# Patient Record
Sex: Female | Born: 1995 | Race: White | Hispanic: No | Marital: Single | Smoking: Never smoker
Health system: Southern US, Community
[De-identification: ages and names within clinical notes are randomized; demographics above are authoritative.]

## PROBLEM LIST (undated history)

## (undated) DIAGNOSIS — G43909 Migraine, unspecified, not intractable, without status migrainosus: Secondary | ICD-10-CM

## (undated) HISTORY — PX: CHOLECYSTECTOMY: SHX55

---

## 2017-09-14 ENCOUNTER — Encounter (HOSPITAL_COMMUNITY): Payer: Self-pay | Admitting: Emergency Medicine

## 2017-09-14 ENCOUNTER — Emergency Department (HOSPITAL_COMMUNITY)
Admission: EM | Admit: 2017-09-14 | Discharge: 2017-09-15 | Disposition: A | Discharge status: Home / Self Care | Payer: Self-pay | Attending: Emergency Medicine | Admitting: Emergency Medicine

## 2017-09-14 DIAGNOSIS — R102 Pelvic and perineal pain: Secondary | ICD-10-CM | POA: Insufficient documentation

## 2017-09-14 HISTORY — DX: Migraine, unspecified, not intractable, without status migrainosus: G43.909

## 2017-09-14 LAB — COMPREHENSIVE METABOLIC PANEL
ALT: 31 U/L (ref 14–54)
AST: 22 U/L (ref 15–41)
Albumin: 3.9 g/dL (ref 3.5–5.0)
Alkaline Phosphatase: 59 U/L (ref 38–126)
Anion gap: 7 (ref 5–15)
BILIRUBIN TOTAL: 0.6 mg/dL (ref 0.3–1.2)
BUN: 5 mg/dL — ABNORMAL LOW (ref 6–20)
CHLORIDE: 103 mmol/L (ref 101–111)
CO2: 26 mmol/L (ref 22–32)
Calcium: 8.7 mg/dL — ABNORMAL LOW (ref 8.9–10.3)
Creatinine, Ser: 0.8 mg/dL (ref 0.44–1.00)
Glucose, Bld: 90 mg/dL (ref 65–99)
POTASSIUM: 3.5 mmol/L (ref 3.5–5.1)
Sodium: 136 mmol/L (ref 135–145)
Total Protein: 6.5 g/dL (ref 6.5–8.1)

## 2017-09-14 LAB — URINALYSIS, ROUTINE W REFLEX MICROSCOPIC
Bilirubin Urine: NEGATIVE
Glucose, UA: NEGATIVE mg/dL
Hgb urine dipstick: NEGATIVE
Ketones, ur: NEGATIVE mg/dL
LEUKOCYTES UA: NEGATIVE
Nitrite: NEGATIVE
PH: 7 (ref 5.0–8.0)
Protein, ur: NEGATIVE mg/dL
Specific Gravity, Urine: 1.018 (ref 1.005–1.030)

## 2017-09-14 LAB — CBC
HEMATOCRIT: 41.5 % (ref 36.0–46.0)
Hemoglobin: 14 g/dL (ref 12.0–15.0)
MCH: 29.2 pg (ref 26.0–34.0)
MCHC: 33.7 g/dL (ref 30.0–36.0)
MCV: 86.5 fL (ref 78.0–100.0)
Platelets: 243 10*3/uL (ref 150–400)
RBC: 4.8 MIL/uL (ref 3.87–5.11)
RDW: 12.4 % (ref 11.5–15.5)
WBC: 10 10*3/uL (ref 4.0–10.5)

## 2017-09-14 LAB — I-STAT BETA HCG BLOOD, ED (MC, WL, AP ONLY): I-stat hCG, quantitative: <5 m[IU]/mL (ref <5)

## 2017-09-14 LAB — LIPASE, BLOOD: Lipase: 36 U/L (ref 11–51)

## 2017-09-14 NOTE — ED Triage Notes (Signed)
Reports lower abdominal cramping for a few weeks worse.  Has had 3 positive tests a few days ago and had 4 negative tests.  Had mirena currently.  No bleeding reported at this time.

## 2017-09-15 ENCOUNTER — Emergency Department (HOSPITAL_COMMUNITY): Payer: Self-pay

## 2017-09-15 LAB — WET PREP, GENITAL
Sperm: NONE SEEN
Trich, Wet Prep: NONE SEEN
Yeast Wet Prep HPF POC: NONE SEEN

## 2017-09-15 NOTE — Discharge Instructions (Addendum)
Take ibuprofen or naproxen as needed for pain. °

## 2017-09-15 NOTE — ED Notes (Signed)
Delay in lab draw edp at bedside. 

## 2017-09-15 NOTE — ED Provider Notes (Signed)
[facility] EMERGENCY DEPARTMENT Provider Note   CSN: [id number] Arrival date & time: 09/14/17  2138     History   Chief Complaint Chief Complaint  Patient presents with  . Abdominal Pain    HPI Isabella Mendoza is a 22 y.o. female.  The history is provided by the patient.  She complains of lower abdominal cramping for the last several weeks, getting gradually worse.  Pain gets is severe as 6/10, and then subsides to 2/10.  Pain is worse when she bends over.  She denies fever, chills, sweats.  She denies back pain.  There is been no nausea, vomiting, diarrhea.  She denies urinary urgency, frequency, tenesmus, dysuria.  She denies any vaginal discharge.  She has had a Mirena IUD for the past year.  She has been taking acetaminophen for pain, with minimal relief.  Past Medical History:  Diagnosis Date  . Migraines     There are no active problems to display for this patient.   Past Surgical History:  Procedure Laterality Date  . CESAREAN SECTION     X2  . CHOLECYSTECTOMY       OB History   None      Home Medications    Prior to Admission medications   Not on File    Family History No family history on file.  Social History Social History   Tobacco Use  . Smoking status: Never Smoker  . Smokeless tobacco: Never Used  Substance Use Topics  . Alcohol use: Yes    Comment: occasionally  . Drug use: Never     Allergies   Patient has no allergy information on record.   Review of Systems Review of Systems  All other systems reviewed and are negative.    Physical Exam Updated Vital Signs BP 112/67 (BP Location: Right Arm)   Pulse 60   Temp 98.1 F (36.7 C) (Oral)   Resp 16   Ht  (1.549 m)   Wt 72.6 kg (160 lb)   SpO2 100%   BMI 30.23 kg/m   Physical Exam  Nursing note and vitals reviewed.  22 year old female, resting comfortably and in no acute distress. Vital signs are normal. Oxygen saturation is 100%, which is  normal. Head is normocephalic and atraumatic. PERRLA, EOMI. Oropharynx is clear. Neck is nontender and supple without adenopathy or JVD. Back is nontender and there is no CVA tenderness. Lungs are clear without rales, wheezes, or rhonchi. Chest is nontender. Heart has regular rate and rhythm without murmur. Abdomen is soft, flat, nontender without masses or hepatosplenomegaly and peristalsis is normoactive. Pelvic: Normal external female genitalia.  Cervix is closed with IUD string visible.  No vaginal discharge.  No cervical motion tenderness.  Fundus is normal size and position.  No adnexal masses or tenderness. Extremities have no cyanosis or edema, full range of motion is present. Skin is warm and dry without rash. Neurologic: Mental status is normal, cranial nerves are intact, there are no motor or sensory deficits.  ED Treatments / Results  Labs (all labs ordered are listed, but only abnormal results are displayed) Labs Reviewed  WET PREP, GENITAL - Abnormal; Notable for the following components:      Result Value   Clue Cells Wet Prep HPF POC PRESENT (*)    WBC, Wet Prep HPF POC FEW (*)    All other components within normal limits  COMPREHENSIVE METABOLIC PANEL - Abnormal; Notable for the following components:   BUN  5 (*)    Calcium 8.7 (*)    All other components within normal limits  LIPASE, BLOOD  CBC  URINALYSIS, ROUTINE W REFLEX MICROSCOPIC  RPR  HIV ANTIBODY (ROUTINE TESTING)  I-STAT BETA HCG BLOOD, ED ([facility], [facility], [facility] ONLY)  GC/CHLAMYDIA PROBE AMP (B[facility]o) NOT AT [facility]   Radiology US Pelvic Complete With Transvaginal  Result Date: 09/15/2017 CLINICAL DATA:  Pelvic pain for 2 weeks. EXAM: TRANSABDOMINAL AND TRANSVAGINAL ULTRASOUND OF PELVIS TECHNIQUE: Both transabdominal and transvaginal ultrasound examinations of the pelvis were performed. Transabdominal technique was performed for global imaging of the pelvis including uterus, ovaries, adnexal regions, and pelvic  cul-de-sac. It was necessary to proceed with endovaginal exam following the transabdominal exam to visualize the ovaries and endometrium. COMPARISON:  None FINDINGS: Uterus Measurements: 9 x 4.2 x 5.3 cm. Uterus is retroflexed period. No fibroids or other mass visualized. Endometrium Thickness: 4.9 mm. There is an echogenic linear focus demonstrated along the lower uterine segment. This could represent an out of position intrauterine device. Correlation with the history of intrauterine device placement is recommended. Alternatively, this could represent postoperative changes associated with history of Caesarean sections. The endometrium is otherwise homogeneous and normal. Right ovary Measurements: 5.3 x 2.7 x 4 cm. A complex cyst, likely hemorrhagic cyst is demonstrated measuring 4.4 x 1.9 x 3.9 cm. No flow is appreciated within the cyst. Normal follicular changes. Left ovary Measurements: 3.2 x 1.7 x 1.9 cm. Normal appearance/no adnexal mass. Other findings Small amount of free fluid in the pelvis. IMPRESSION: 1. Echogenic linear focus in the lower uterine segment. Consider out of place intrauterine device or postoperative changes associated with history of cesarean sections. 2. Hemorrhagic cyst on the right ovary measuring 4.4 cm maximal dimension. Based on size in a premenopausal patient, additional follow-up is not indicated. 3. Small amount of free fluid in the pelvis is likely physiologic. Electronically Signed   By: Burman Nieves M.D.   On: 09/15/2017 06:29    Procedures Procedures   Medications Ordered in ED Medications - No data to display   Initial Impression / Assessment and Plan / ED Course  I have reviewed the triage vital signs and the nursing notes.  Pertinent labs & imaging results that were available during my care of the patient were reviewed by me and considered in my medical decision making (see chart for details).  Pelvic pain of uncertain cause.  Possible endometriosis.  With  normal exam, doubt pelvic inflammatory disease.  Laboratory work-up is unremarkable including clean urine.  Old records are reviewed, and she was seen in the emergency department at [facility] 3 days ago with no diagnosis found.  She will be sent for a pelvic ultrasound.  Wet prep is positive for clue cells, but clinically she does not have bacterial vaginosis so will not be put on antibiotics.  Pelvic ultrasound was significant for possibly malpositioned IUD versus scar tissue from prior C-section.  She has had 2 cesarean births.  Patient is advised of these findings and is referred to [facility] outpatient clinic for follow-up.  Told to try using NSAIDs rather than acetaminophen for pelvic pain.  Return precautions discussed.  Final Clinical Impressions(s) / ED Diagnoses   Final diagnoses:  Pelvic pain in female    ED Discharge Orders    None       Dione Booze, MD 09/15/17 [phone]

## 2017-09-15 NOTE — ED Notes (Signed)
Patient transported to Ultrasound 

## 2017-09-16 LAB — GC/CHLAMYDIA PROBE AMP (~~LOC~~) NOT AT ARMC
Chlamydia: NEGATIVE
Neisseria Gonorrhea: NEGATIVE

## 2017-09-16 LAB — HIV ANTIBODY (ROUTINE TESTING W REFLEX): HIV Screen 4th Generation wRfx: NONREACTIVE

## 2017-09-16 LAB — RPR: RPR: NONREACTIVE

## 2017-11-10 ENCOUNTER — Emergency Department (HOSPITAL_COMMUNITY)
Admission: EM | Admit: 2017-11-10 | Discharge: 2017-11-10 | Disposition: A | Discharge status: Home / Self Care | Payer: Medicaid Other | Attending: Emergency Medicine | Admitting: Emergency Medicine

## 2017-11-10 ENCOUNTER — Other Ambulatory Visit: Payer: Self-pay

## 2017-11-10 ENCOUNTER — Encounter (HOSPITAL_COMMUNITY): Payer: Self-pay | Admitting: *Deleted

## 2017-11-10 DIAGNOSIS — Z3202 Encounter for pregnancy test, result negative: Secondary | ICD-10-CM | POA: Insufficient documentation

## 2017-11-10 DIAGNOSIS — Z79899 Other long term (current) drug therapy: Secondary | ICD-10-CM | POA: Insufficient documentation

## 2017-11-10 DIAGNOSIS — N12 Tubulo-interstitial nephritis, not specified as acute or chronic: Secondary | ICD-10-CM | POA: Insufficient documentation

## 2017-11-10 LAB — URINALYSIS, ROUTINE W REFLEX MICROSCOPIC
Bilirubin Urine: NEGATIVE
GLUCOSE, UA: NEGATIVE mg/dL
Ketones, ur: NEGATIVE mg/dL
Nitrite: NEGATIVE
Specific Gravity, Urine: 1.01 (ref 1.005–1.030)
pH: 6.5 (ref 5.0–8.0)

## 2017-11-10 LAB — PREGNANCY, URINE: Preg Test, Ur: NEGATIVE

## 2017-11-10 LAB — URINALYSIS, MICROSCOPIC (REFLEX): RBC / HPF: NONE SEEN RBC/hpf (ref 0–5)

## 2017-11-10 MED ORDER — CEPHALEXIN 500 MG PO CAPS: 500.0000 mg | ORAL_CAPSULE | Freq: Three times a day (TID) | ORAL | 0 refills | Status: AC

## 2017-11-10 MED ORDER — CEPHALEXIN 500 MG PO CAPS: 500.0000 mg | ORAL_CAPSULE | Freq: Three times a day (TID) | ORAL | 0 refills | Status: DC

## 2017-11-10 NOTE — ED Triage Notes (Signed)
Pt reports right side back pain for several days, radiates across her back and occ into her abd. Denies pain with urination or fever but is having some nausea.

## 2017-11-10 NOTE — ED Provider Notes (Signed)
[facility] EMERGENCY DEPARTMENT Provider Note   CSN: [id number] Arrival date & time: 11/10/17  1513     History   Chief Complaint Chief Complaint  Patient presents with  . Flank Pain  . Back Pain    HPI Javaria Knapke is a 22 y.o. female.  HPI   22 year old female presents today with complaints of right flank and back pain. Patient notes one week ago she developed urinary frequency, last today she had some episodes of nausea, no vomiting. She describes the pain is achy sharp with movement, denies any vaginal discharge or bleeding, denies any abdominal pain vomiting or fever. No recent injury or exposure or urinary tract infections.    Past Medical History:  Diagnosis Date  . Migraines     There are no active problems to display for this patient.   Past Surgical History:  Procedure Laterality Date  . CESAREAN SECTION     X2  . CHOLECYSTECTOMY       OB History   None      Home Medications    Prior to Admission medications   Medication Sig Start Date End Date Taking? Authorizing Provider  acetaminophen (TYLENOL) 500 MG tablet Take 500 mg by mouth 2 (two) times daily as needed for headache (migraine).   Yes [provider]  ibuprofen (ADVIL,MOTRIN) 200 MG tablet Take 600-800 mg by mouth 2 (two) times daily as needed for headache (migraine).   Yes [provider]  cephALEXin (KEFLEX) 500 MG capsule Take 1 capsule (500 mg total) by mouth 3 (three) times daily. 11/10/17   Eyvonne Mechanic, PA-C    Family History History reviewed. No pertinent family history.  Social History Social History   Tobacco Use  . Smoking status: Never Smoker  . Smokeless tobacco: Never Used  Substance Use Topics  . Alcohol use: Yes    Comment: occasionally  . Drug use: Never     Allergies   Celery oil; Oyster calcium; Erythromycin; and Sulfisoxazole   Review of Systems Review of Systems  All other systems reviewed and are  negative.    Physical Exam Updated Vital Signs BP 115/75 (BP Location: Right Arm)   Pulse 62   Temp 98.6 F (37 C) (Oral)   Resp 14   LMP 10/11/2017   SpO2 100%   Physical Exam  Constitutional: She is oriented to person, place, and time. She appears well-developed and well-nourished.  HENT:  Head: Normocephalic and atraumatic.  Eyes: Pupils are equal, round, and reactive to light. Conjunctivae are normal. Right eye exhibits no discharge. Left eye exhibits no discharge. No scleral icterus.  Neck: Normal range of motion. No JVD present. No tracheal deviation present.  Pulmonary/Chest: Effort normal. No stridor.  Abdominal: She exhibits no distension and no mass. There is no tenderness. There is no rebound and no guarding. No hernia.  No CVA tenderness, no tenderness to the back  Neurological: She is alert and oriented to person, place, and time. Coordination normal.  Psychiatric: She has a normal mood and affect. Her behavior is normal. Judgment and thought content normal.  Nursing note and vitals reviewed.   ED Treatments / Results  Labs (all labs ordered are listed, but only abnormal results are displayed) Labs Reviewed  URINALYSIS, ROUTINE W REFLEX MICROSCOPIC - Abnormal; Notable for the following components:      Result Value   Color, Urine YELLOW (*)    APPearance CLOUDY (*)    Hgb urine dipstick SMALL (*)  Protein, ur TRACE (*)    Leukocytes, UA MODERATE (*)    All other components within normal limits  URINALYSIS, MICROSCOPIC (REFLEX) - Abnormal; Notable for the following components:   Bacteria, UA FEW (*)    All other components within normal limits  URINE CULTURE  PREGNANCY, URINE    EKG None  Radiology No results found.  Procedures Procedures (including critical care time)  Medications Ordered in ED Medications - No data to display   Initial Impression / Assessment and Plan / ED Course  I have reviewed the triage vital signs and the nursing  notes.  Pertinent labs & imaging results that were available during my care of the patient were reviewed by me and considered in my medical decision making (see chart for details).     Labs: urinalysis, urine culture, urine pregnancy  Imaging:  Consults:  Therapeutics:  Discharge Meds: Keflex  Assessment/Plan: 22 year old female presents today with complaints of urinary frequency and flank pain. Patient's urine does show moderate leukocytes, she'll be treated for urinary tract infection with questionable pyelonephritis. She is well-appearing distress, no elevation in temperature and heart rate. Patient tolerating by mouth discharged with antibiotics, strict return precautions. She verbalized understanding and agreement to today's plan.      Final Clinical Impressions(s) / ED Diagnoses   Final diagnoses:  Pyelonephritis    ED Discharge Orders        Ordered    cephALEXin (KEFLEX) 500 MG capsule  3 times daily,   Status:  Discontinued     11/10/17 2003    cephALEXin (KEFLEX) 500 MG capsule  3 times daily     11/10/17 2010       Eyvonne MechanicHedges, Shaely Gadberry, PA-C 11/11/17 1340    Jacalyn LefevreHaviland, Julie, MD 11/11/17 1615

## 2017-11-10 NOTE — Discharge Instructions (Addendum)
Please read attached information. If you experience any new or worsening signs or symptoms please return to the emergency room for evaluation. Please follow-up with your primary care provider or specialist as discussed. Please use medication prescribed only as directed and discontinue taking if you have any concerning signs or symptoms.   °

## 2017-11-13 LAB — URINE CULTURE: Culture: >=100000 — AB

## 2017-11-14 ENCOUNTER — Telehealth: Payer: Self-pay

## 2017-11-14 NOTE — Telephone Encounter (Signed)
Post ED Visit - Positive Culture Follow-up  Culture report reviewed by antimicrobial stewardship pharmacist:  []  Enzo BiNathan Batchelder, Pharm.D. []  Celedonio MiyamotoJeremy Frens, Pharm.D., BCPS AQ-ID []  Garvin FilaMike Maccia, Pharm.D., BCPS []  Georgina PillionElizabeth Martin, Pharm.D., BCPS []  TuscolaMinh Pham, VermontPharm.D., BCPS, AAHIVP []  Estella HuskMichelle Turner, Pharm.D., BCPS, AAHIVP []  Lysle Pearlachel Rumbarger, PharmD, BCPS []  Phillips Climeshuy Dang, PharmD, BCPS []  Agapito GamesAlison Masters, PharmD, BCPS []  Verlan FriendsErin Deja, PharmD Ruben Imony Rudisill Pharm D Positive urine culture Treated with Cephalexin, organism sensitive to the same and no further patient follow-up is required at this time.  Jerry CarasCullom, [place] Cromartie Burnett 11/14/2017, 10:01 AM

## 2018-09-18 IMAGING — US US PELVIS COMPLETE TRANSABD/TRANSVAG
1 series · 13 of 25 positions shown · non-contrast
Comparison: None

CLINICAL DATA: Pelvic pain for 2 weeks.

EXAM:
TRANSABDOMINAL AND TRANSVAGINAL ULTRASOUND OF PELVIS
TECHNIQUE: Both transabdominal and transvaginal ultrasound examinations of the
pelvis were performed. Transabdominal technique was performed for
global imaging of the pelvis including uterus, ovaries, adnexal
regions, and pelvic cul-de-sac. It was necessary to proceed with
endovaginal exam following the transabdominal exam to visualize the
ovaries and endometrium.

[Series 1: us pelvis complete transabd/transvag · 0.18mm/px · 13 of 70 slices shown]
[im 1/70]
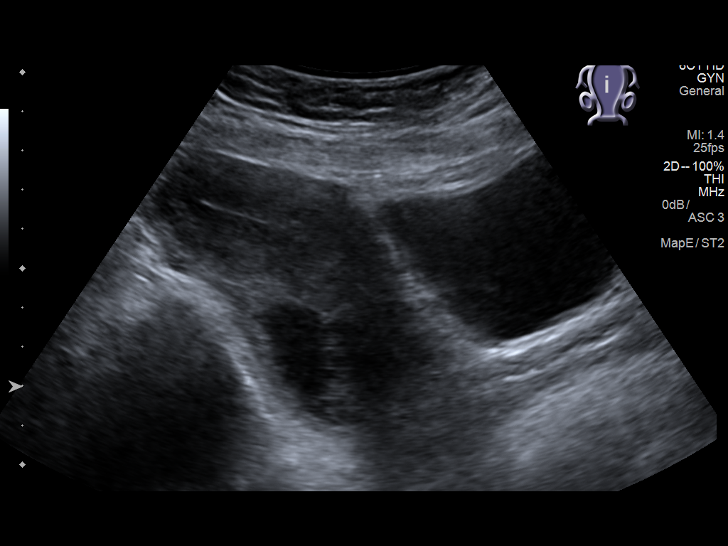
[im 6/70]
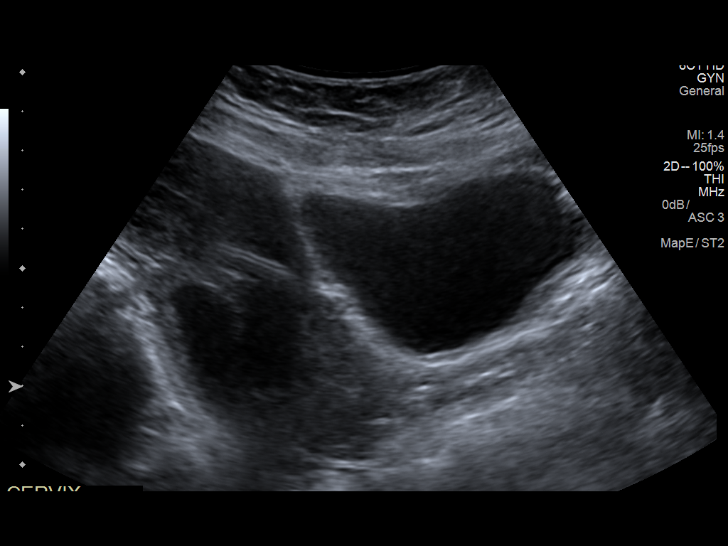
[im 12/70]
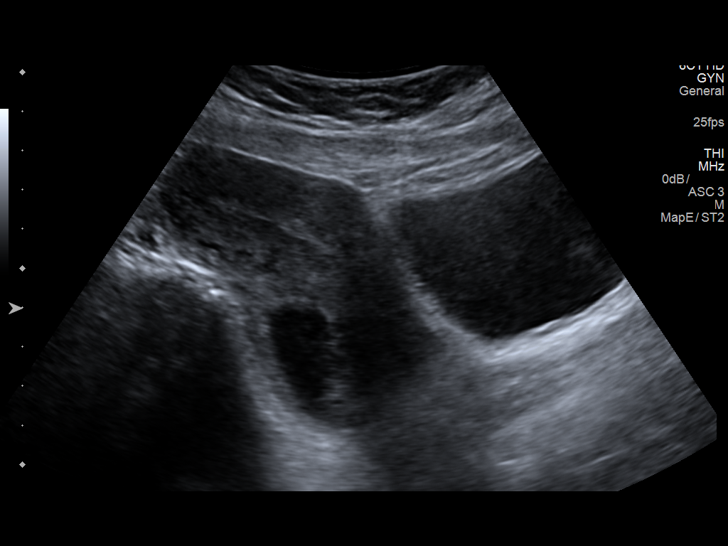
[im 18/70]
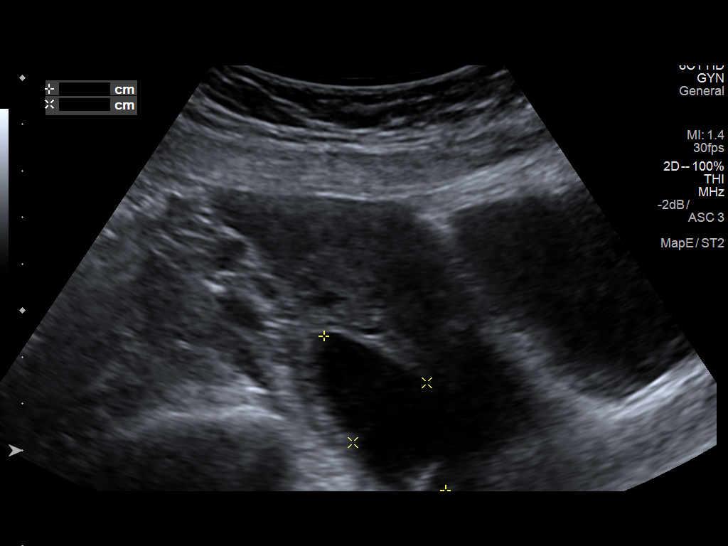
[im 24/70]
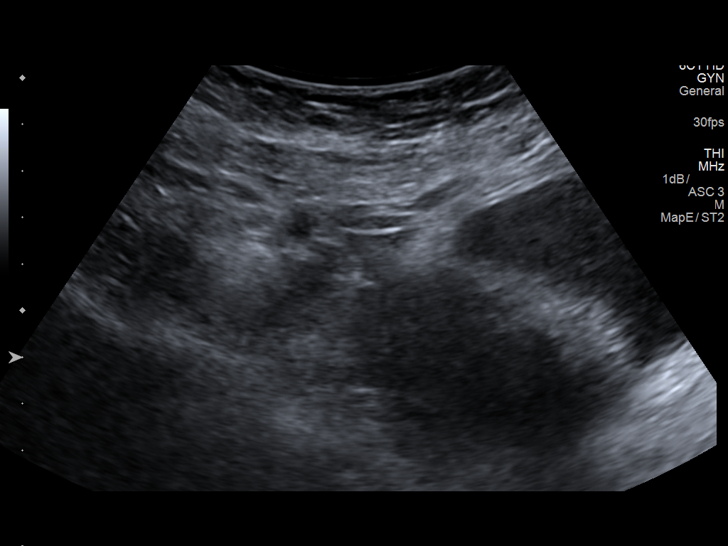
[im 29/70]
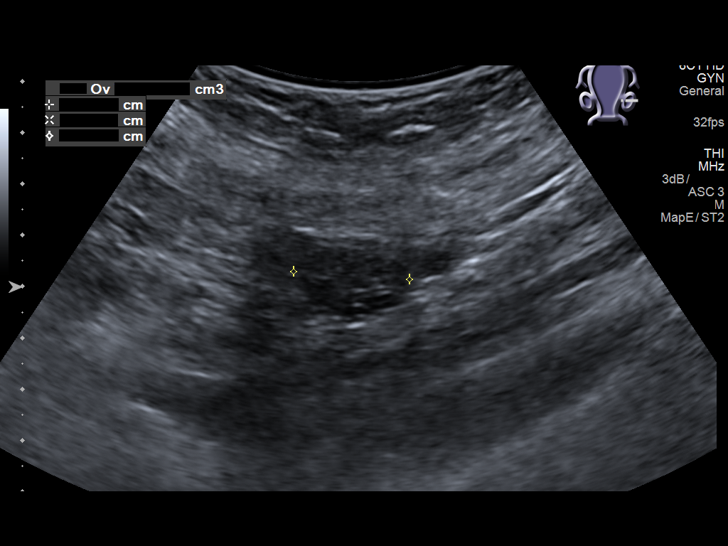
[im 35/70]
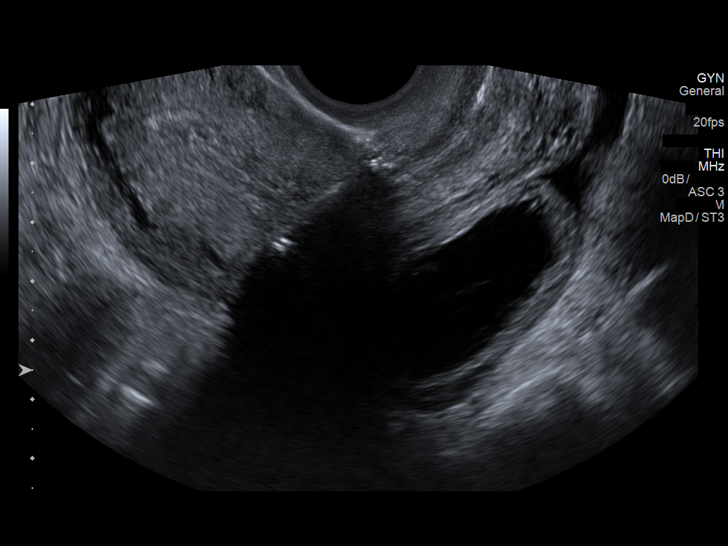
[im 41/70]
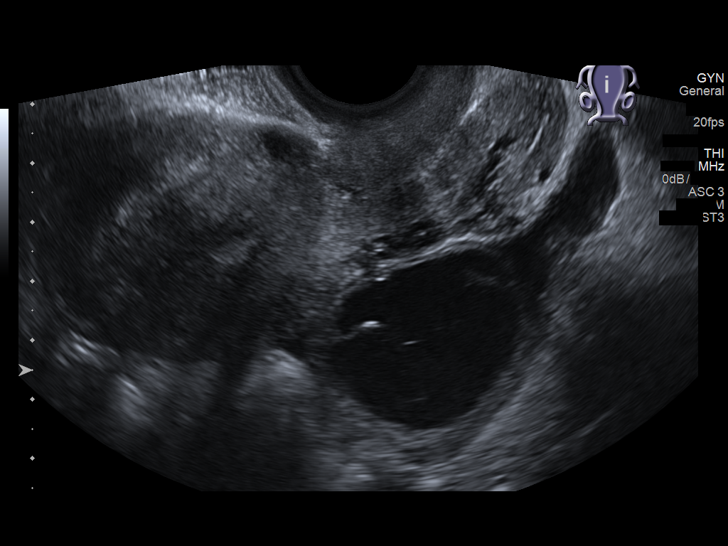
[im 47/70]
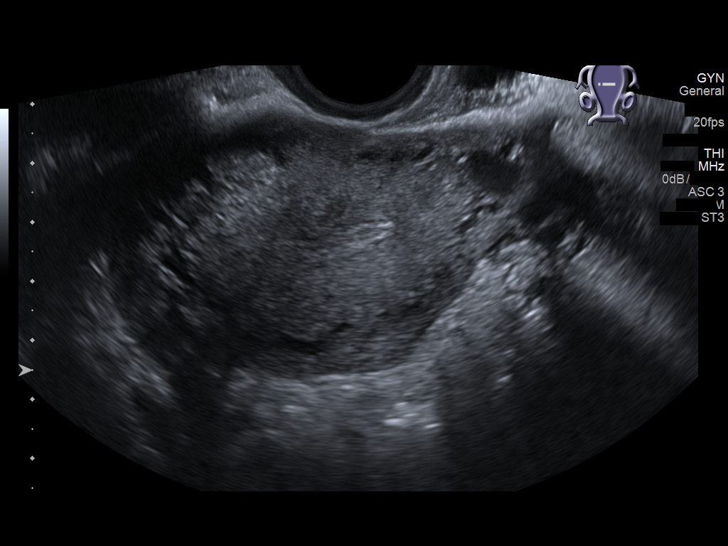
[im 52/70]
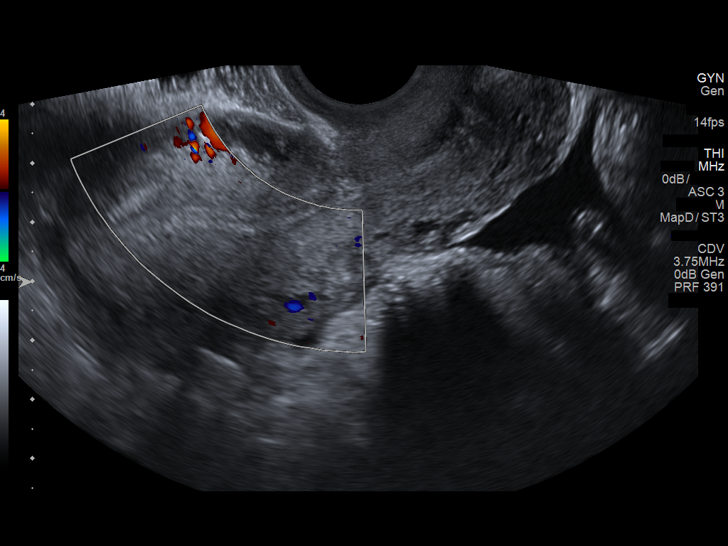
[im 58/70]
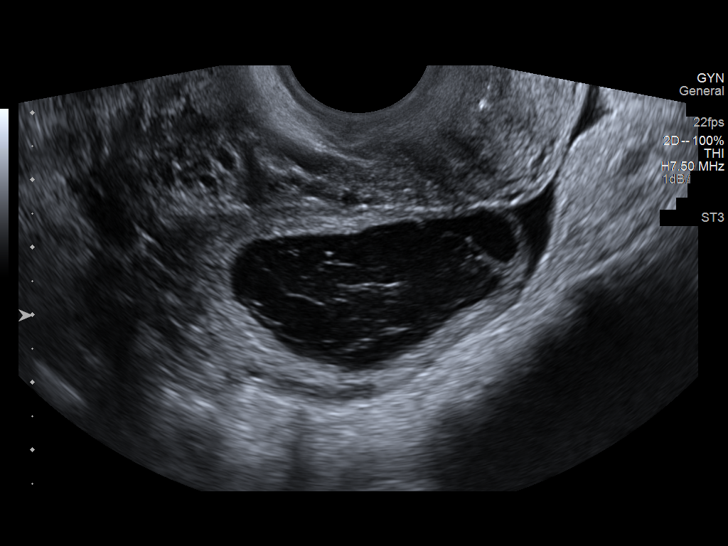
[im 64/70]
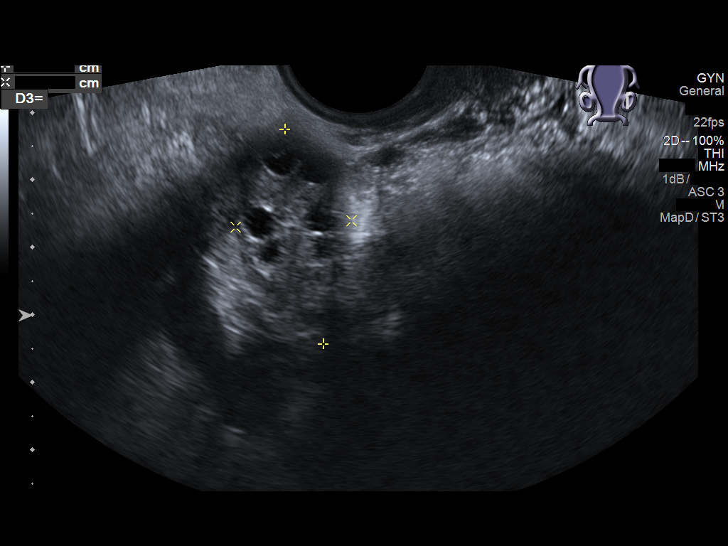
[im 70/70]
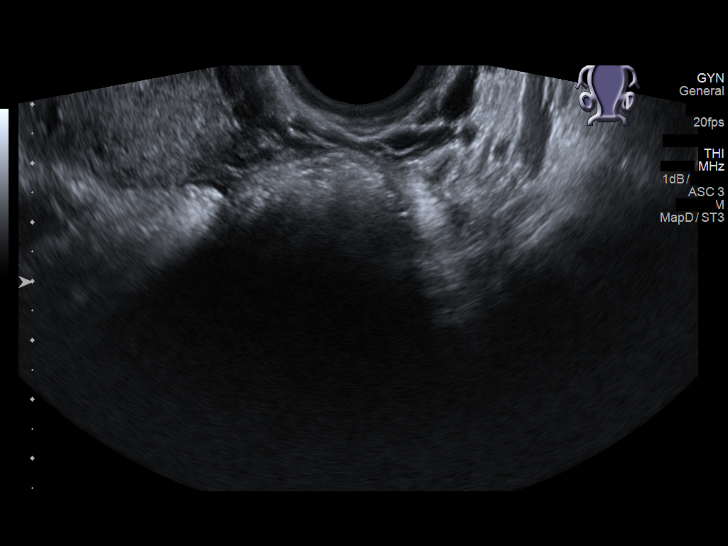

[13 of 25 positions shown; findings below may reference images not displayed]

FINDINGS: Uterus

Measurements: 9 x 4.2 x 5.3 cm. Uterus is retroflexed period. No
fibroids or other mass visualized.

Endometrium

Thickness: 4.9 mm. There is an echogenic linear focus demonstrated
along the lower uterine segment. This could represent an out of
position intrauterine device. Correlation with the history of
intrauterine device placement is recommended. Alternatively, this
could represent postoperative changes associated with history of
Caesarean sections. The endometrium is otherwise homogeneous and
normal.

Right ovary

Measurements: 5.3 x 2.7 x 4 cm. A complex cyst, likely hemorrhagic
cyst is demonstrated measuring 4.4 x 1.9 x 3.9 cm. No flow is
appreciated within the cyst.. Normal follicular changes.

Left ovary

Measurements: 3.2 x 1.7 x 1.9 cm. Normal appearance/no adnexal mass.

Other findings

Small amount of free fluid in the pelvis.
IMPRESSION: 1. Echogenic linear focus in the lower uterine segment. Consider out
of place intrauterine device or postoperative changes associated
with history of cesarean sections.
2. Hemorrhagic cyst on the right ovary measuring 4.4 cm maximal
dimension. Based on size in a premenopausal patient, additional
follow-up is not indicated.
3. Small amount of free fluid in the pelvis is likely physiologic.
